# Patient Record
Sex: Male | Born: 1995 | Race: Black or African American | Hispanic: No | Marital: Single | Smoking: Never smoker
Health system: Southern US, Community
[De-identification: ages and names within clinical notes are randomized; demographics above are authoritative.]

## PROBLEM LIST (undated history)

## (undated) ENCOUNTER — Ambulatory Visit (HOSPITAL_COMMUNITY): Admission: EM | Discharge status: Absent without leave | Payer: BLUE CROSS/BLUE SHIELD

---

## 2018-04-15 ENCOUNTER — Ambulatory Visit (HOSPITAL_COMMUNITY)
Admission: EM | Admit: 2018-04-15 | Discharge: 2018-04-15 | Disposition: A | Discharge status: Home / Self Care | Payer: BLUE CROSS/BLUE SHIELD

## 2018-04-16 ENCOUNTER — Ambulatory Visit (HOSPITAL_COMMUNITY)
Admission: EM | Admit: 2018-04-16 | Discharge: 2018-04-16 | Disposition: A | Discharge status: Home / Self Care | Payer: BLUE CROSS/BLUE SHIELD | Attending: Family Medicine | Admitting: Family Medicine

## 2018-04-16 ENCOUNTER — Other Ambulatory Visit: Payer: Self-pay

## 2018-04-16 ENCOUNTER — Encounter (HOSPITAL_COMMUNITY): Payer: Self-pay

## 2018-04-16 DIAGNOSIS — Z202 Contact with and (suspected) exposure to infections with a predominantly sexual mode of transmission: Secondary | ICD-10-CM | POA: Diagnosis not present

## 2018-04-16 DIAGNOSIS — Z113 Encounter for screening for infections with a predominantly sexual mode of transmission: Secondary | ICD-10-CM

## 2018-04-16 LAB — POCT URINALYSIS DIP (DEVICE)
BILIRUBIN URINE: NEGATIVE
GLUCOSE, UA: NEGATIVE mg/dL
HGB URINE DIPSTICK: NEGATIVE
Ketones, ur: NEGATIVE mg/dL
LEUKOCYTES UA: NEGATIVE
Nitrite: NEGATIVE
Protein, ur: NEGATIVE mg/dL
Specific Gravity, Urine: 1.025 (ref 1.005–1.030)
UROBILINOGEN UA: 0.2 mg/dL (ref 0.0–1.0)
pH: 7 (ref 5.0–8.0)

## 2018-04-16 MED ORDER — AZITHROMYCIN 250 MG PO TABS
1000.0000 mg | ORAL_TABLET | Freq: Once | ORAL | Status: AC
  Administered 2018-04-16: 1000 mg via ORAL

## 2018-04-16 MED ORDER — AZITHROMYCIN 250 MG PO TABS
ORAL_TABLET | ORAL | Status: AC
  Filled 2018-04-16: qty 4

## 2018-04-16 NOTE — ED Provider Notes (Signed)
[facility]   [id number] 04/16/18 Arrival Time: 1315  ASSESSMENT & PLAN:  1. Possible exposure to STD    Declines empiric Rocephin. Prefers to await cytology results.   Discharge Instructions     You have been given the following medications today for treatment of suspected chlamydia:  azithromycin (ZITHROMAX) tablet 1,000 mg  Even though we have treated you today, we have sent testing for sexually transmitted infections. We will notify you of any positive results once they are received. If required, we will prescribe any medications you might need.  Please refrain from all sexual activity for at least the next seven days.     Pending: Labs Reviewed  POCT URINALYSIS DIP (DEVICE)  URINE CYTOLOGY ANCILLARY ONLY   Will notify of any positive results. Instructed to refrain from sexual activity for at least seven days.  Reviewed expectations re: course of current medical issues. Questions answered. Outlined signs and symptoms indicating need for more acute intervention. Patient verbalized understanding. After Visit Summary given.   SUBJECTIVE:  Johnny Heffern is a 22 y.o. male who reports possible exposure to Chlamydia. Male partner informed him that she has recently tested positive.Marland Kitchen He does not report any symptoms at this time. Afebrile. No abdominal or pelvic pain. No n/v. No rashes or lesions. Sexually active with single male partner. OTC treatment: none. No h/o STD reported.  No LMP for male patient.  ROS: As per HPI.  OBJECTIVE:  Vitals:   04/16/18 1405  BP: 119/61  Pulse: 69  Resp: 16  Temp: 97.9 F (36.6 C)  TempSrc: Oral  SpO2: 100%    General appearance: alert, cooperative, appears stated age and no distress Throat: lips, mucosa, and tongue normal; teeth and gums normal Back: no CVA tenderness Abdomen: soft, non-tender GU: normal genitalia Skin: warm and dry Psychological: alert and cooperative. Normal mood and affect.  Results  for orders placed or performed during the hospital encounter of 04/16/18  POCT urinalysis dip (device)  Result Value Ref Range   Glucose, UA NEGATIVE NEGATIVE mg/dL   Bilirubin Urine NEGATIVE NEGATIVE   Ketones, ur NEGATIVE NEGATIVE mg/dL   Specific Gravity, Urine 1.025 1.005 - 1.030   Hgb urine dipstick NEGATIVE NEGATIVE   pH 7.0 5.0 - 8.0   Protein, ur NEGATIVE NEGATIVE mg/dL   Urobilinogen, UA 0.2 0.0 - 1.0 mg/dL   Nitrite NEGATIVE NEGATIVE   Leukocytes, UA NEGATIVE NEGATIVE    Labs Reviewed  POCT URINALYSIS DIP (DEVICE)  URINE CYTOLOGY ANCILLARY ONLY    No Known Allergies  Social History   Socioeconomic History  . Marital status: Single    Spouse name: Not on file  . Number of children: Not on file  . Years of education: Not on file  . Highest education level: Not on file  Occupational History  . Not on file  Social Needs  . Financial resource strain: Not on file  . Food insecurity:    Worry: Not on file    Inability: Not on file  . Transportation needs:    Medical: Not on file    Non-medical: Not on file  Tobacco Use  . Smoking status: Never Smoker  . Smokeless tobacco: Never Used  Substance and Sexual Activity  . Alcohol use: Never    Frequency: Never  . Drug use: Yes    Frequency: 2.0 times per week    Types: Marijuana  . Sexual activity: Yes    Birth control/protection: None  Lifestyle  . Physical activity:  Days per week: Not on file    Minutes per session: Not on file  . Stress: Not on file  Relationships  . Social connections:    Talks on phone: Not on file    Gets together: Not on file    Attends religious service: Not on file    Active member of club or organization: Not on file    Attends meetings of clubs or organizations: Not on file    Relationship status: Not on file  . Intimate partner violence:    Fear of current or ex partner: Not on file    Emotionally abused: Not on file    Physically abused: Not on file    Forced sexual  activity: Not on file  Other Topics Concern  . Not on file  Social History Narrative  . Not on file          Mardella Layman, MD 04/16/18 1423

## 2018-04-16 NOTE — ED Triage Notes (Signed)
Pt presents to [facility] for possible STD, pt states he has possible has been exposed to an STD by one of his partners, pt complains of itching. Pt denies any discharge.

## 2018-04-16 NOTE — Discharge Instructions (Signed)
You have been given the following medications today for treatment of suspected chlamydia: ° °azithromycin (ZITHROMAX) tablet 1,000 mg ° °Even though we have treated you today, we have sent testing for sexually transmitted infections. We will notify you of any positive results once they are received. If required, we will prescribe any medications you might need. ° °Please refrain from all sexual activity for at least the next seven days. ° °

## 2018-04-18 LAB — URINE CYTOLOGY ANCILLARY ONLY
CHLAMYDIA, DNA PROBE: POSITIVE — AB
Neisseria Gonorrhea: NEGATIVE
Trichomonas: NEGATIVE

## 2018-04-20 ENCOUNTER — Telehealth (HOSPITAL_COMMUNITY): Payer: Self-pay

## 2018-04-20 NOTE — Telephone Encounter (Signed)
Chlamydia is positive.  This was treated at the urgent care visit with po zithromax 1g.  Pt contacted and made aware of results. Educated pt to please refrain from sexual intercourse for 7 days to give the medicine time to work.  Sexual partners need to be notified and tested/treated.  Condoms may reduce risk of reinfection.  Recheck or followup with PCP for further evaluation if symptoms are not improving.  [facility] notified 

## 2018-06-18 ENCOUNTER — Ambulatory Visit (HOSPITAL_COMMUNITY)
Admission: EM | Admit: 2018-06-18 | Discharge: 2018-06-18 | Disposition: A | Discharge status: Home / Self Care | Payer: Worker's Compensation | Attending: Family Medicine | Admitting: Family Medicine

## 2018-06-18 ENCOUNTER — Encounter (HOSPITAL_COMMUNITY): Payer: Self-pay | Admitting: Emergency Medicine

## 2018-06-18 ENCOUNTER — Ambulatory Visit (INDEPENDENT_AMBULATORY_CARE_PROVIDER_SITE_OTHER): Payer: Worker's Compensation

## 2018-06-18 DIAGNOSIS — S99912A Unspecified injury of left ankle, initial encounter: Secondary | ICD-10-CM

## 2018-06-18 DIAGNOSIS — Z026 Encounter for examination for insurance purposes: Secondary | ICD-10-CM | POA: Insufficient documentation

## 2018-06-18 DIAGNOSIS — S93402A Sprain of unspecified ligament of left ankle, initial encounter: Secondary | ICD-10-CM

## 2018-06-18 MED ORDER — PREDNISONE 20 MG PO TABS: 40.0000 mg | ORAL_TABLET | Freq: Every day | ORAL | 0 refills | Status: AC

## 2018-06-18 MED ORDER — ACETAMINOPHEN 325 MG PO TABS: 650.0000 mg | ORAL_TABLET | Freq: Four times a day (QID) | ORAL | 0 refills | Status: AC | PRN

## 2018-06-18 NOTE — ED Provider Notes (Signed)
[facility]    CSN: [id number] Arrival date & time: 06/18/18  1559     History   Chief Complaint Chief Complaint  Patient presents with  . Leg Injury    HPI Johnny Thomas is a 22 y.o. male.   HPI  Ankle Pain: Patient complains of injury to the left ankle. 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 Injury involved a palate of 2 liter coke bottles falling over on patient. He sustained injury only to his left ankle which he the majority of the bottles landed on his lower left leg and foot. He reports swelling and pain has worsened since injury occurred. Injury occurred approximately 2-3 hours ago. He has not taken any medication and is now applying ice applications to the injured area. Denies numbness and tingling of foot or toes.  Home Medications    Prior to Admission medications   Not on File    Family History No family history on file.  Social History Social History   Tobacco Use  . Smoking status: Never Smoker  . Smokeless tobacco: Never Used  Substance Use Topics  . Alcohol use: Never    Frequency: Never  . Drug use: Yes    Frequency: 2.0 times per week    Types: Marijuana     Allergies   Patient has no known allergies.   Review of Systems Review of Systems Pertinent negatives listed in HPI  Physical Exam Triage Vital Signs ED Triage Vitals  Enc Vitals Group     BP 06/18/18 1705 117/68     Pulse Rate 06/18/18 1704 69     Resp 06/18/18 1704 16     Temp 06/18/18 1704 98.3 F (36.8 C)     Temp src --      SpO2 06/18/18 1704 100 %     Weight --      Height --      Head Circumference --      Peak Flow --      Pain Score 06/18/18 1704 9     Pain Loc --      Pain Edu? --      Excl. in GC? --    No data found.  Updated Vital Signs BP 117/68   Pulse 69   Temp 98.3 F (36.8 C)   Resp 16   SpO2 100%   Visual Acuity Right Eye Distance:   Left Eye Distance:   Bilateral  Distance:    Right Eye Near:   Left Eye Near:    Bilateral Near:     Physical Exam He appears well, vital signs are normal.  General appearance: alert, well developed, well nourished, cooperative and in no distress Head: Normocephalic, without obvious abnormality, atraumatic Respiratory: Respirations even and unlabored, normal respiratory rate Ankle, Left: Mild edema and tenderness over the left lateral malleolus. No tenderness over the medial aspect of the ankle. The fifth metatarsal is not tender. The ankle joint is intact without excessive opening on stressing. AC is non-tender with palpation.  Foot and leg exam is normal. Neurologic: Mental status: Alert, oriented to person, place, and time, thought content appropriate.  UC Treatments / Results  Labs (all labs ordered are listed, but only abnormal results are displayed) Labs Reviewed - No data to display  EKG None  Radiology Dg Ankle Complete Left  Result Date: 06/18/2018 CLINICAL DATA:  Two bottles fell on ankle.  Pain. EXAM: LEFT ANKLE COMPLETE -  3+ VIEW COMPARISON:  None. FINDINGS: There is no evidence of fracture, dislocation, or joint effusion. There is no evidence of arthropathy or other focal bone abnormality. Soft tissues are unremarkable. IMPRESSION: Negative. Electronically Signed   By: Gerome Samavid  Williams III M.D   On: 06/18/2018 17:28    Procedures Procedures (including critical care time)  Medications Ordered in UC Medications - No data to display  Initial Impression / Assessment and Plan / UC Course  I have reviewed the triage vital signs and the nursing notes.  Pertinent labs & imaging results that were available during my care of the patient were reviewed by me and considered in my medical decision making (see chart for details).     Left ankle injury is likely related to ankle sprain. Will apply ankle lace-up boot. RICE recommended. To reduce inflammation, a short course of prednisone prescribed and tylenol  recommended for pain. Follow-up with occupational health on 06/20/2018. Work note provided through 06/20/2018. Strict follow-up precautions provided.   Final Clinical Impressions(s) / UC Diagnoses   Final diagnoses:  Sprain of left ankle, unspecified ligament, initial encounter  Encounter related to worker's compensation claim     Discharge Instructions     Contact occupational health on Monday to schedule a Worker's Comp visit.     ED Prescriptions    Medication Sig Dispense Auth. Provider   predniSONE (DELTASONE) 20 MG tablet Take 2 tablets (40 mg total) by mouth daily with breakfast for 5 days. 10 tablet Bing NeighborsHarris, Janaki Exley S, FNP   acetaminophen (TYLENOL) 325 MG tablet Take 2 tablets (650 mg total) by mouth every 6 (six) hours as needed. 60 tablet Bing NeighborsHarris, Tanylah Schnoebelen S, FNP     Controlled Substance Prescriptions [place] Controlled Substance Registry consulted? No   Bing NeighborsHarris, Zyan Coby S, [place] 06/24/18 [phone]

## 2018-06-18 NOTE — ED Triage Notes (Signed)
Pt states he was at work and a bunch of 2 liter coke bottles fell on his L ankle, foot and leg.

## 2018-06-18 NOTE — Discharge Instructions (Signed)
Contact occupational health on Monday to schedule a Worker's Comp visit.

## 2019-10-22 IMAGING — DX DG ANKLE COMPLETE 3+V*L*
3 series · 3 of 3 positions shown · non-contrast
Comparison: None.

CLINICAL DATA: Two bottles fell on ankle.  Pain.

EXAM:
LEFT ANKLE COMPLETE - 3+ VIEW

[ankle ap]
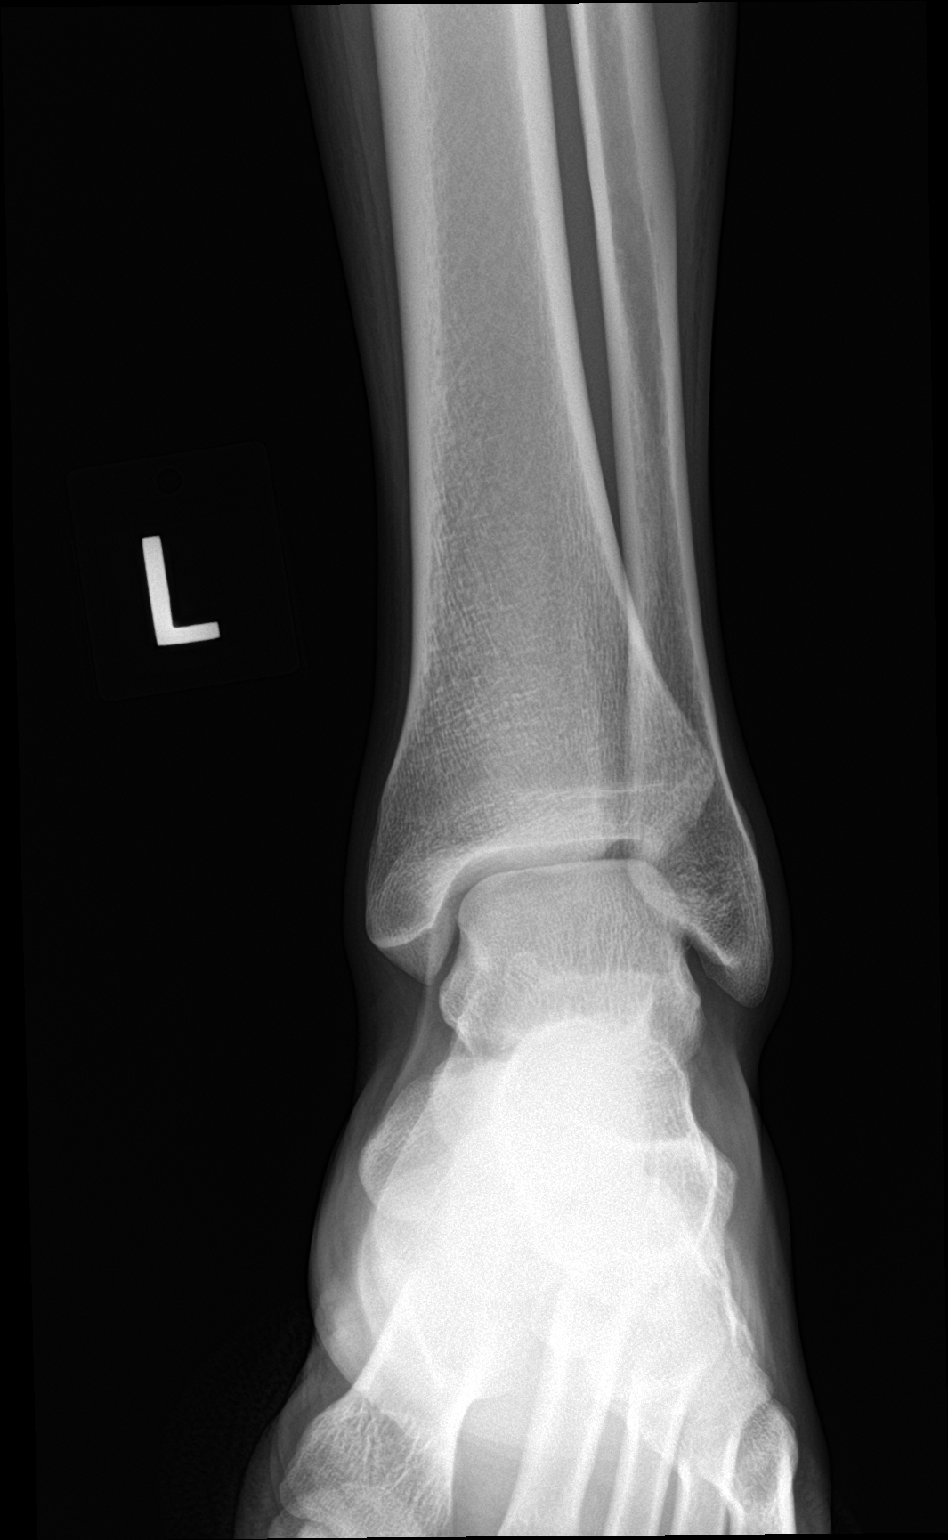

[ankle obl]
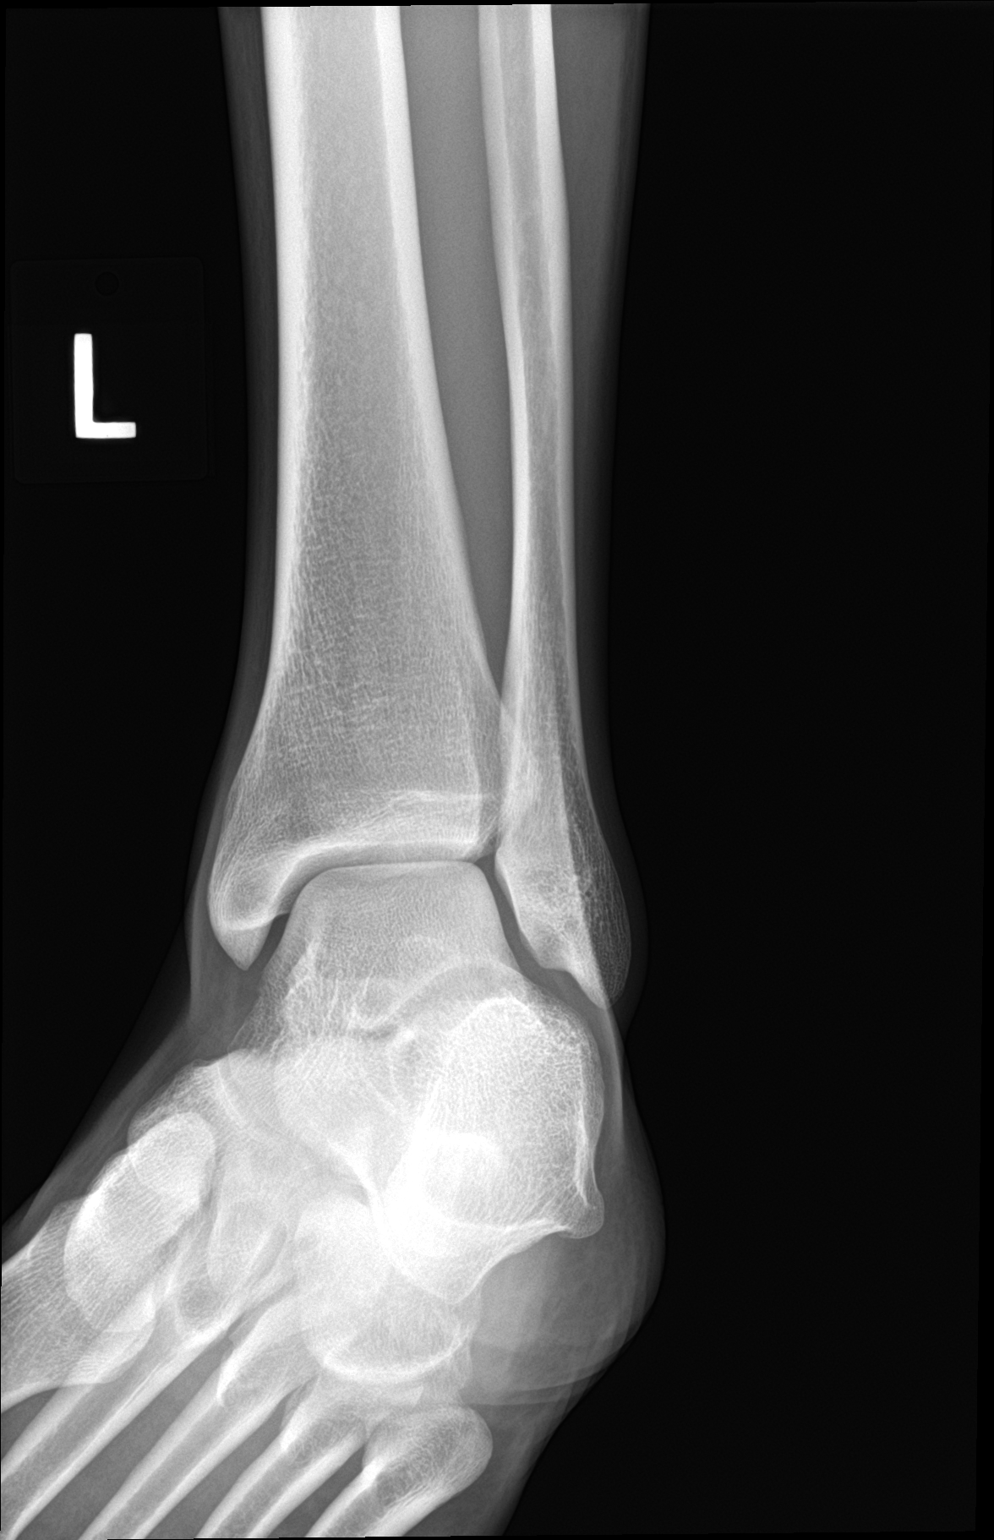

[ankle lat]
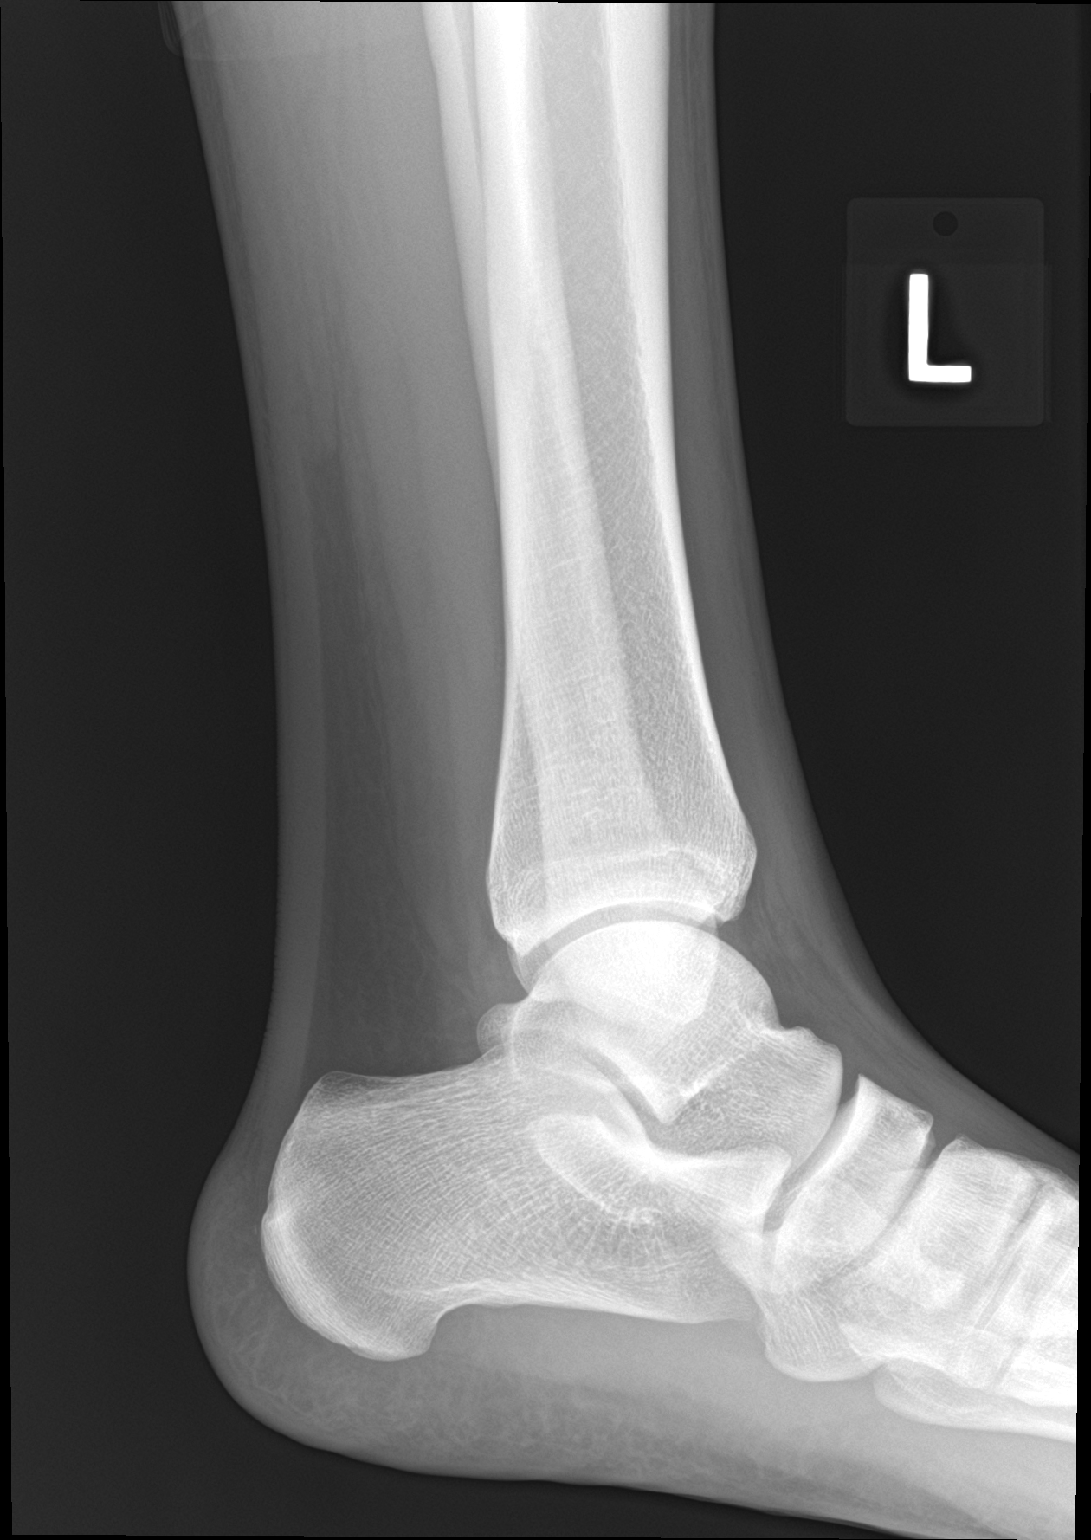

[3 of 3 positions shown; findings below may reference images not displayed]

FINDINGS: There is no evidence of fracture, dislocation, or joint effusion.
There is no evidence of arthropathy or other focal bone abnormality.
Soft tissues are unremarkable.
IMPRESSION: Negative.
